# Patient Record
Sex: Female | Born: 1985 | Hispanic: No | Marital: Married | Smoking: Never smoker
Health system: Southern US, Community
[De-identification: ages and names within clinical notes are randomized; demographics above are authoritative.]

---

## 2019-12-23 ENCOUNTER — Other Ambulatory Visit: Payer: Self-pay

## 2019-12-23 ENCOUNTER — Emergency Department (HOSPITAL_BASED_OUTPATIENT_CLINIC_OR_DEPARTMENT_OTHER)
Admission: EM | Admit: 2019-12-23 | Discharge: 2019-12-23 | Disposition: A | Discharge status: Home / Self Care | Payer: BLUE CROSS/BLUE SHIELD | Attending: Emergency Medicine | Admitting: Emergency Medicine

## 2019-12-23 ENCOUNTER — Encounter (HOSPITAL_BASED_OUTPATIENT_CLINIC_OR_DEPARTMENT_OTHER): Payer: Self-pay | Admitting: Emergency Medicine

## 2019-12-23 DIAGNOSIS — Z20822 Contact with and (suspected) exposure to covid-19: Secondary | ICD-10-CM | POA: Diagnosis not present

## 2019-12-23 DIAGNOSIS — J069 Acute upper respiratory infection, unspecified: Secondary | ICD-10-CM | POA: Diagnosis not present

## 2019-12-23 LAB — RESP PANEL BY RT-PCR (FLU A&B, COVID) ARPGX2
Influenza A by PCR: NEGATIVE
Influenza B by PCR: NEGATIVE
SARS Coronavirus 2 by RT PCR: NEGATIVE

## 2019-12-23 NOTE — ED Notes (Signed)
Covid Swab obtained and to the lab 

## 2019-12-23 NOTE — ED Provider Notes (Signed)
[facility] EMERGENCY DEPARTMENT Provider Note   CSN: [id number] Arrival date & time: 12/23/19  0016     History Chief Complaint  Patient presents with  . URI    Yesenia Harvey is a 34 y.o. female.  HPI     This is a 34 year old female who presents requesting Covid testing.  Patient reports that she was at work on Friday when she began to feel chilled and generally weak.  She left work early.  Over the weekend she states she had fever T-max of 100.  She had some sore throat but that has improved.  No cough, shortness of breath, chest pain.  No loss of sense of taste or smell.  No known sick contacts or Covid exposures.  She has had intermittent myalgias.  Patient reports that she needs a negative Covid test to return to work.  She currently states she feels fine.  She reports she is fully vaccinated against COVID-19.  History reviewed. No pertinent past medical history.  There are no problems to display for this patient.   History reviewed. No pertinent surgical history.   OB History   No obstetric history on file.     No family history on file.  Social History   Tobacco Use  . Smoking status: Never Smoker  . Smokeless tobacco: Never Used  Substance Use Topics  . Alcohol use: Never  . Drug use: Never    Home Medications Prior to Admission medications   Not on File    Allergies    Patient has no allergy information on record.  Review of Systems   Review of Systems  Constitutional: Positive for chills and fever.  HENT: Positive for sore throat.   Respiratory: Negative for cough and shortness of breath.   Cardiovascular: Negative for chest pain.  Gastrointestinal: Negative for abdominal pain, nausea and vomiting.  Musculoskeletal: Positive for myalgias.  All other systems reviewed and are negative.   Physical Exam Updated Vital Signs BP 120/76 (BP Location: Right Arm)   Pulse 88   Temp 97.7 F (36.5 C) (Oral)   Resp 18   Ht 1.524 m (5')    Wt 59 kg   SpO2 100%   BMI 25.39 kg/m   Physical Exam Vitals and nursing note reviewed.  Constitutional:      Appearance: She is well-developed and well-nourished. She is not ill-appearing.  HENT:     Head: Normocephalic and atraumatic.     Nose: Nose normal.     Mouth/Throat:     Mouth: Mucous membranes are moist.     Pharynx: No posterior oropharyngeal erythema.  Eyes:     Pupils: Pupils are equal, round, and reactive to light.  Cardiovascular:     Rate and Rhythm: Normal rate and regular rhythm.     Heart sounds: Normal heart sounds.  Pulmonary:     Effort: Pulmonary effort is normal. No respiratory distress.     Breath sounds: No wheezing.  Abdominal:     General: Bowel sounds are normal.     Palpations: Abdomen is soft.  Musculoskeletal:     Cervical back: Neck supple.     Right lower leg: No edema.     Left lower leg: No edema.  Skin:    General: Skin is warm and dry.  Neurological:     Mental Status: She is alert and oriented to person, place, and time.  Psychiatric:        Mood and Affect: Mood and affect  and mood normal.     ED Results / Procedures / Treatments   Labs (all labs ordered are listed, but only abnormal results are displayed) Labs Reviewed  RESP PANEL BY RT-PCR (FLU A&B, COVID) ARPGX2    EKG None  Radiology No results found.  Procedures Procedures (including critical care time)  Medications Ordered in ED Medications - No data to display  ED Course  I have reviewed the triage vital signs and the nursing notes.  Pertinent labs & imaging results that were available during my care of the patient were reviewed by me and considered in my medical decision making (see chart for details).    MDM Rules/Calculators/A&P                          Patient presents with upper respiratory symptoms.  She is overall nontoxic and vital signs are reassuring.  She is requesting testing in order to return to work.  She currently is without complaint.   She is afebrile.  Physical exam is benign.  PCR test sent and she is negative for growth Covid and flu.  These were pended for the patient.  Given that she is without any complaints at this time, do not feel she needs further work-up.  After history, exam, and medical workup I feel the patient has been appropriately medically screened and is safe for discharge home. Pertinent diagnoses were discussed with the patient. Patient was given return precautions.  Final Clinical Impression(s) / ED Diagnoses Final diagnoses:  Viral upper respiratory tract infection    Rx / DC Orders ED Discharge Orders    None       Shon Baton, MD 12/23/19 [phone]

## 2019-12-23 NOTE — ED Triage Notes (Signed)
C/O fever, body aches, stuffy nose, and decreased taste and smell since Friday.  Reports she needs a PCR result in order to go back to work.

## 2019-12-23 NOTE — Discharge Instructions (Addendum)
You were seen today for fevers and myalgias.  Your Covid and influenza testing are negative.  You may return to work.

## 2020-12-16 ENCOUNTER — Ambulatory Visit: Payer: BC Managed Care – PPO | Admitting: Physical Therapy

## 2020-12-16 ENCOUNTER — Encounter (HOSPITAL_BASED_OUTPATIENT_CLINIC_OR_DEPARTMENT_OTHER): Payer: Self-pay

## 2020-12-16 ENCOUNTER — Other Ambulatory Visit: Payer: Self-pay

## 2020-12-16 ENCOUNTER — Emergency Department (HOSPITAL_BASED_OUTPATIENT_CLINIC_OR_DEPARTMENT_OTHER)
Admission: EM | Admit: 2020-12-16 | Discharge: 2020-12-16 | Disposition: A | Discharge status: Home / Self Care | Payer: BC Managed Care – PPO | Attending: Emergency Medicine | Admitting: Emergency Medicine

## 2020-12-16 DIAGNOSIS — Z20822 Contact with and (suspected) exposure to covid-19: Secondary | ICD-10-CM | POA: Insufficient documentation

## 2020-12-16 DIAGNOSIS — J029 Acute pharyngitis, unspecified: Secondary | ICD-10-CM | POA: Insufficient documentation

## 2020-12-16 DIAGNOSIS — R070 Pain in throat: Secondary | ICD-10-CM | POA: Diagnosis present

## 2020-12-16 LAB — RESP PANEL BY RT-PCR (FLU A&B, COVID) ARPGX2
Influenza A by PCR: NEGATIVE
Influenza B by PCR: NEGATIVE
SARS Coronavirus 2 by RT PCR: NEGATIVE

## 2020-12-16 LAB — GROUP A STREP BY PCR: Group A Strep by PCR: NOT DETECTED

## 2020-12-16 MED ORDER — ACETAMINOPHEN 325 MG PO TABS
650.0000 mg | ORAL_TABLET | Freq: Once | ORAL | Status: AC
  Administered 2020-12-16: 19:00:00 650 mg via ORAL
  Filled 2020-12-16: qty 2

## 2020-12-16 MED ORDER — LIDOCAINE VISCOUS HCL 2 % MT SOLN
15.0000 mL | Freq: Once | OROMUCOSAL | Status: AC
  Administered 2020-12-16: 19:00:00 15 mL via OROMUCOSAL
  Filled 2020-12-16: qty 15

## 2020-12-16 NOTE — ED Triage Notes (Signed)
Pt arrives ambulatory to ED with c/o sore throat starting yesterday with chills today. Denies being around anyone with flu or covid. Pt did take ibuprofen 200 mg around 4 pm.

## 2020-12-16 NOTE — ED Provider Notes (Signed)
[facility] EMERGENCY DEPARTMENT Provider Note   CSN: [id number] Arrival date & time: 12/16/20  1755     History Chief Complaint  Patient presents with   Sore Throat    Yesenia Harvey is a 35 y.o. female.  35 year old female who presents emergency department chief complaint of sore throat.  Patient states that the symptoms began in the middle of the night last night.  She states she had trouble sleeping due to the pain in her throat.  She took ibuprofen and throat spray without significant relief in her symptoms.  She has a history of strep throat in the past.  She has had no recent foreign travel.  She denies cough, fever.  She has pain with swallowing but is able to tolerate fluids  The history is provided by the patient. No language interpreter was used.  Sore Throat This is a new problem. The current episode started 12 to 24 hours ago. The problem occurs constantly. The problem has not changed since onset.Pertinent negatives include no chest pain, no abdominal pain, no headaches and no shortness of breath. The symptoms are aggravated by swallowing. Nothing relieves the symptoms. She has tried acetaminophen for the symptoms.      History reviewed. No pertinent past medical history.  There are no problems to display for this patient.   History reviewed. No pertinent surgical history.   OB History   No obstetric history on file.     No family history on file.  Social History   Tobacco Use   Smoking status: Never   Smokeless tobacco: Never  Vaping Use   Vaping Use: Never used  Substance Use Topics   Alcohol use: Never   Drug use: Never    Home Medications Prior to Admission medications   Medication Sig Start Date End Date Taking? Authorizing Provider  Levonorgestrel-Ethinyl Estradiol (AMETHIA) 0.15-0.03 &0.01 MG tablet Take 1 tablet by mouth daily. 12/08/20  Yes [provider]  medroxyPROGESTERone (PROVERA) 10 MG tablet Take by mouth. 12/08/20   Yes [provider]  Multiple Vitamin (MULTIVITAMIN) capsule Take 1 capsule by mouth daily.    [provider]    Allergies    Patient has no known allergies.  Review of Systems   Review of Systems  Respiratory:  Negative for shortness of breath.   Cardiovascular:  Negative for chest pain.  Gastrointestinal:  Negative for abdominal pain.  Neurological:  Negative for headaches.   Physical Exam Updated Vital Signs BP 120/68 (BP Location: Right Arm)    Pulse (!) 106    Temp 98.4 F (36.9 C) (Oral)    Resp 18    Ht 5' (1.524 m)    Wt 63 kg    LMP 11/25/2020    SpO2 100%    BMI 27.15 kg/m   Physical Exam  ED Results / Procedures / Treatments   Labs (all labs ordered are listed, but only abnormal results are displayed) Labs Reviewed  RESP PANEL BY RT-PCR (FLU A&B, COVID) ARPGX2  GROUP A STREP BY PCR    EKG None  Radiology No results found.  Procedures Procedures   Medications Ordered in ED Medications  lidocaine (XYLOCAINE) 2 % viscous mouth solution 15 mL (has no administration in time range)    ED Course  I have reviewed the triage vital signs and the nursing notes.  Pertinent labs & imaging results that were available during my care of the patient were reviewed by me and considered in my medical  decision making (see chart for details).    MDM Rules/Calculators/A&P                          Here with sore throat.  I ordered and reviewed a strep test as well as a flu and COVID test.  All of them are negative. Pt afebrile without tonsillar exudate, negative strep. Presents with mild cervical lymphadenopathy, & dysphagia; diagnosis of viral pharyngitis. No abx indicated. DC w symptomatic tx for pain  Pt does not appear dehydrated, but did discuss importance of water rehydration. Presentation non concerning for PTA or infxn spread to soft tissue. No trismus or uvula deviation. Specific return precautions discussed. Pt able to drink water in ED without  difficulty with intact air way. Recommended PCP follow up.    Final Clinical Impression(s) / ED Diagnoses Final diagnoses:  None    Rx / DC Orders ED Discharge Orders     None        Arthor Captain, PA-C 12/17/20 1544    Melene Plan, DO 12/18/20 1506

## 2020-12-16 NOTE — Discharge Instructions (Signed)
Contact a health care provider if: You have large, tender lumps in your neck. You have a rash. You cough up green, yellow-brown, or bloody mucus. Get help right away if: Your neck becomes stiff. You drool or are unable to swallow liquids. You cannot drink or take medicines without vomiting. You have severe pain that does not go away, even after you take medicine. You have trouble breathing, and it is not caused by a stuffy nose. You have new pain and swelling in your joints such as the knees, ankles, wrists, or elbows.

## 2020-12-18 ENCOUNTER — Ambulatory Visit: Payer: BC Managed Care – PPO | Admitting: Physical Therapy

## 2021-01-11 ENCOUNTER — Ambulatory Visit: Payer: BC Managed Care – PPO | Admitting: Physical Therapy

## 2021-01-14 ENCOUNTER — Encounter: Payer: BLUE CROSS/BLUE SHIELD | Admitting: Physical Therapy

## 2021-01-19 ENCOUNTER — Ambulatory Visit: Payer: BC Managed Care – PPO | Admitting: Physical Therapy

## 2021-01-21 ENCOUNTER — Encounter: Payer: Self-pay | Admitting: Physical Therapy

## 2021-01-21 ENCOUNTER — Ambulatory Visit: Payer: BC Managed Care – PPO | Attending: Family Medicine | Admitting: Physical Therapy

## 2021-01-21 ENCOUNTER — Other Ambulatory Visit: Payer: Self-pay

## 2021-01-21 ENCOUNTER — Telehealth: Payer: Self-pay | Admitting: Physical Therapy

## 2021-01-21 DIAGNOSIS — M6281 Muscle weakness (generalized): Secondary | ICD-10-CM | POA: Insufficient documentation

## 2021-01-21 DIAGNOSIS — M62838 Other muscle spasm: Secondary | ICD-10-CM | POA: Diagnosis present

## 2021-01-21 DIAGNOSIS — M79672 Pain in left foot: Secondary | ICD-10-CM | POA: Diagnosis present

## 2021-01-21 DIAGNOSIS — M79671 Pain in right foot: Secondary | ICD-10-CM | POA: Diagnosis present

## 2021-01-21 NOTE — Therapy (Signed)
[facility] [address]  [address] [place], [place], [address] Phone: [phone]   Fax:  [phone]  Physical Therapy Treatment  Patient Details  Name: Yesenia Harvey MRN: [id number] Date of Birth: 1985-06-09 Referring Provider (PT): Eula Listenominic McKinley   Encounter Date: 01/21/2021   PT End of Session - 01/21/21 1146     Visit Number 1    Number of Visits 12    Date for PT Re-Evaluation 03/04/21    Authorization Type [employer]    PT Start Time 1147    PT Stop Time 1240    PT Time Calculation (min) 53 min    Activity Tolerance Patient tolerated treatment well    Behavior [facility] Therapy [facility] for tasks assessed/performed             History reviewed. No pertinent past medical history.  History reviewed. No pertinent surgical history.  There were no vitals filed for this visit.   Subjective Assessment - 01/21/21 1150     Subjective In 2017 she had right heel pain when getting out of bed. Since then started with the left foot. She also has a bunion on the left foot. She also reports intermittent knee pain. She now has pain in bil heels upon standing in the morning and with stairs. She does some runners stretches but not every morning and only until it feels better.  Standing is limited to one hour but new shoes with slight heel have helped some with that.    Pertinent History iron deficiency    How long can you stand comfortably? 2 hours    How long can you walk comfortably? 30 min    Patient Stated Goals to get rid of heel pain    Currently in Pain? Yes    Pain Score 7     Pain Location Heel    Pain Orientation Right;Left    Pain Descriptors / Indicators Sharp    Pain Type Chronic pain    Pain Radiating Towards up into legs    Pain Onset More than a month ago    Pain Frequency Intermittent    Aggravating Factors  walking, prolonged standing    Pain Relieving Factors stretching, Clark's shoes, massage    Multiple Pain  Sites Yes    Pain Location Knee    Pain Orientation Right;Left    Pain Descriptors / Indicators Aching    Pain Type Chronic pain    Pain Onset More than a month ago    Pain Frequency Intermittent                [facility] PT Assessment - 01/21/21 0001       Assessment   Medical Diagnosis right and left plantar faciitis    Referring Provider (PT) Dominic Althea CharonMcKinley    Onset Date/Surgical Date 06/04/15    Prior Therapy no      Precautions   Precautions None      Restrictions   Weight Bearing Restrictions No      Balance Screen   Has the patient fallen in the past 6 months No    Has the patient had a decrease in activity level because of a fear of falling?  No    Is the patient reluctant to leave their home because of a fear of falling?  No      Home Environment   Living Environment Private residence    Additional Comments stairs hurt in the morning  Prior Function   Level of Independence Independent    Vocation Full time employment    Vocation Requirements sitting at computer      Posture/Postural Control   Posture Comments mild pronation of bil feet; left hallux valgus      ROM / Strength   AROM / PROM / Strength AROM;PROM;Strength      AROM   Overall AROM Comments B hips/knees WNL    AROM Assessment Site Ankle    Right/Left Ankle Right;Left    Right Ankle Dorsiflexion 0    Right Ankle Plantar Flexion 65    Right Ankle Inversion 38    Right Ankle Eversion 28    Left Ankle Dorsiflexion 7    Left Ankle Plantar Flexion 50    Left Ankle Inversion 35    Left Ankle Eversion 25      PROM   PROM Assessment Site Ankle    Right/Left Ankle Right;Left    Right Ankle Dorsiflexion 6    Right Ankle Plantar Flexion 75    Right Ankle Inversion 57    Right Ankle Eversion 40    Left Ankle Dorsiflexion 12    Left Ankle Plantar Flexion 54    Left Ankle Inversion 56    Left Ankle Eversion 42      Strength   Strength Assessment Site Hip;Knee;Ankle    Right/Left Hip  Right;Left    Right Hip Flexion 4-/5    Right Hip Extension 4+/5    Right Hip External Rotation  4+/5    Right Hip Internal Rotation 4+/5    Right Hip ABduction 4/5    Left Hip Flexion 4/5    Left Hip Extension 4-/5    Left Hip External Rotation 4+/5    Left Hip Internal Rotation 4+/5    Left Hip ABduction 4-/5    Right/Left Knee Right;Left    Right Knee Flexion 4-/5    Right Knee Extension 4-/5    Left Knee Flexion 4-/5    Left Knee Extension 4/5    Right/Left Ankle Right;Left    Right Ankle Dorsiflexion 4+/5    Right Ankle Plantar Flexion 5/5    Right Ankle Inversion 4/5    Right Ankle Eversion 4+/5    Left Ankle Dorsiflexion 4+/5    Left Ankle Plantar Flexion 4/5    Left Ankle Inversion 4/5    Left Ankle Eversion 4+/5      Flexibility   Soft Tissue Assessment /Muscle Length yes    Hamstrings marked tightness B    Quadriceps WNL    ITB WNL    Piriformis right tight      Palpation   Palpation comment mild tenderness Rt plantar fascia insertion; increased tone TPs left lateral gastroc      Ambulation/Gait   Gait Comments WNL                                    PT Education - 01/21/21 1306     Education Details HEP; POC    Person(s) Educated Patient    Methods Explanation;Demonstration;Handout    Comprehension Verbalized understanding;Returned demonstration              PT Short Term Goals - 01/21/21 1306       PT SHORT TERM GOAL #1   Title ind and compliant with initial HEP    Time 2    Period Weeks  Status New    Target Date 02/04/21      PT SHORT TERM GOAL #2   Title Patient to regularly stand and walk throughout the day to decrease effects of sedentary job on strength and flexibility.    Time 3    Period Weeks    Status New               PT Long Term Goals - 01/21/21 1606       PT LONG TERM GOAL #1   Title Patient to report no pain in bil fee upon standing in the morning and decreased pain by 90% or more  with walking, stairs and standing to cook meals.    Time 6    Period Weeks    Status New    Target Date 03/04/21      PT LONG TERM GOAL #2   Title Patient to demonstrate improved strength in B LEs to 4+/5 or better to help decrease knee pain and improve function.    Time 6    Period Weeks    Status New      PT LONG TERM GOAL #3   Title Improved right ankle DF to >= 7 deg and left ankle PF to >= 60 deg to normalize ankle mobility.    Time 6    Period Weeks    Status New      PT LONG TERM GOAL #4   Title Patient to report decreased knee pain by >= 90% with ADLs.    Time 6    Period Weeks    Status New                   Plan - 01/21/21 1253     Clinical Impression Statement Patient presents with c/o bil foot pain since 2017. Pain started in the right heel but now she feels pain in both upon standing in the morning, with walking, stairs and with prolonged standing when cooking. Pain has improved somewhat since she began stretching and also due to wearing shoes with a slight heel, but when she wears flats the pain returns. She also reports intermittent bil knee pain which she thinks is related to the foot pain. Yesenia PandySaadia has flexibility deficits affecting right ankle DF and left ankle plantar flexion ROM. She has normal bil great toe extension and full bil knee and hip ROM. She is very tight in bil hamstrings and has moderate right piriformis tightness. She is generally deconditioned in BLE as far as strength goes which is likely contributing to her distal pain. She will benefit from skilled PT to address these deficits.    Personal Factors and Comorbidities Fitness;Time since onset of injury/illness/exacerbation    Examination-Activity Limitations Locomotion Level;Stand;Stairs    Examination-Participation Restrictions Meal Prep    Stability/Clinical Decision Making Stable/Uncomplicated    Clinical Decision Making Low    Rehab Potential Excellent    PT Frequency 2x / week    PT  Duration 6 weeks    PT Treatment/Interventions ADLs/Self Care Home Management;Cryotherapy;Electrical Stimulation;Iontophoresis 4mg /ml Dexamethasone;Moist Heat;Ultrasound;Neuromuscular re-education;Balance training;Therapeutic exercise;Therapeutic activities;Patient/family education;Manual techniques;Taping    PT Next Visit Plan Review HEP and progress as needed; work on BLE strength, DN to plantar fascia/gastrocs (have not discussed yet); eccentric heel raises; encourage mobility during day (see STG)    PT Home Exercise Plan [email or web address]    Consulted and Agree with Plan of Care Patient             Patient will  benefit from skilled therapeutic intervention in order to improve the following deficits and impairments:  Decreased range of motion, Pain, Impaired flexibility, Decreased strength, Postural dysfunction  Visit Diagnosis: Pain in left foot  Pain in right foot  Muscle weakness (generalized)  Other muscle spasm     Problem List There are no problems to display for this patient.  Solon Palm, PT 01/21/2021, 4:15 PM  [facility] [address]  [address] [place], [place], [address] Phone: [phone]   Fax:  [phone]  Name: Yesenia Harvey MRN: [id number] Date of Birth: [birth year]

## 2021-01-21 NOTE — Patient Instructions (Signed)
Access Code: [email or web address] URL: [email or web address] Date: 01/21/2021 Prepared by: Raynelle Fanning  Exercises Supine Hamstring Stretch with Strap - 2 x daily - 7 x weekly - 1 sets - 3 reps - 60 sec hold Supine Hamstring Stretch with Doorway - 1 x daily - 7 x weekly - 1 sets - 3 reps - 60 sec hold Gastroc Stretch on Step - 1 x daily - 7 x weekly - 1 sets - 3 reps - 30-60 sec hold Heel Raises with Counter Support - 1 x daily - 7 x weekly - 1-3 sets - 10 reps - 3 sec hold

## 2021-01-25 ENCOUNTER — Encounter: Payer: Self-pay | Admitting: Physical Therapy

## 2021-01-25 ENCOUNTER — Other Ambulatory Visit: Payer: Self-pay

## 2021-01-25 ENCOUNTER — Ambulatory Visit: Payer: BC Managed Care – PPO | Admitting: Physical Therapy

## 2021-01-25 DIAGNOSIS — M79671 Pain in right foot: Secondary | ICD-10-CM

## 2021-01-25 DIAGNOSIS — M79672 Pain in left foot: Secondary | ICD-10-CM

## 2021-01-25 DIAGNOSIS — M62838 Other muscle spasm: Secondary | ICD-10-CM

## 2021-01-25 DIAGNOSIS — M6281 Muscle weakness (generalized): Secondary | ICD-10-CM

## 2021-01-25 NOTE — Therapy (Addendum)
PHYSICAL THERAPY DISCHARGE SUMMARY  Visits from Start of Care: 2  Current functional level related to goals / functional outcomes: See below.  Patient has had excessive no-shows/late cancellations due to work (cancelled 10 appts same day, no-showed 1), and only completed 2 visits during plan of care.     Remaining deficits: See below   Education / Equipment: HEP  Plan: Patient goals were not met. Patient is being discharged due to poor compliance/attendance.  She will need new order to return to therapy.    She may need different location due to work hours.     Rennie Natter, PT, DPT   [facility] [address]  [place], [place], [address] Phone: [phone]   Fax:  [phone]  Physical Therapy Treatment  Patient Details  Name: Yesenia Harvey MRN: [id number] Date of Birth: 1985/04/04 Referring Provider (PT): Rhina Brackett   Encounter Date: 01/25/2021   PT End of Session - 01/25/21 1110     Visit Number 2    Number of Visits 12    Date for PT Re-Evaluation 03/04/21    Authorization Type BC[employer]    PT Start Time 1110    PT Stop Time 1155    PT Time Calculation (min) 45 min    Activity Tolerance Patient tolerated treatment well    Behavior During Therapy [facility] for tasks assessed/performed             History reviewed. No pertinent past medical history.  History reviewed. No pertinent surgical history.  There were no vitals filed for this visit.   Subjective Assessment - 01/25/21 1111     Subjective Pt. reports that she has been doing the stretches and the one on the stairs helping.  Noticed more quad pain and low back tiredness Saturday.    Pertinent History iron deficiency    How long can you stand comfortably? 2 hours    How long can you walk comfortably? 30 min    Patient Stated Goals to get rid of heel pain    Currently in Pain? No/denies    Pain Onset More than a month ago    Pain Onset  More than a month ago                               [facility] Adult PT Treatment/Exercise - 01/25/21 0001       Self-Care   Self-Care Other Self-Care Comments   education on importance of frequent movement breaks, daily exercise     Exercises   Exercises Knee/Hip      Knee/Hip Exercises: Stretches   Passive Hamstring Stretch Both;1 rep;30 seconds    Passive Hamstring Stretch Limitations supine with stretch out strap      Knee/Hip Exercises: Aerobic   Nustep L5 x 6 min      Knee/Hip Exercises: Machines for Strengthening   Cybex Knee Extension 20# x 15    Cybex Knee Flexion 20# x 15    Cybex Leg Press 20# x 10      Knee/Hip Exercises: Standing   Heel Raises Both;10 reps    Hip Abduction Stengthening;Both;10 reps    Hip Extension Stengthening;Both;10 reps    Functional Squat 2 sets;10 reps    Functional Squat Limitations chair behind for safety, cues for form      Knee/Hip Exercises: Supine   Bridges Strengthening;10 reps    Other Supine  Knee/Hip Exercises ankle alphabet supine      Manual Therapy   Manual Therapy Soft tissue mobilization    Manual therapy comments to decrease muscle soreness    Soft tissue mobilization brief STM with roller stick for demo, with return demo by patient.                       PT Short Term Goals - 01/25/21 1212       PT SHORT TERM GOAL #1   Title ind and compliant with initial HEP    Time 2    Period Weeks    Status On-going   01/25/21- modified and progressed.   Target Date 02/04/21      PT SHORT TERM GOAL #2   Title Patient to regularly stand and walk throughout the day to decrease effects of sedentary job on strength and flexibility.    Time 3    Period Weeks    Status On-going   01/25/21 - educated              PT Long Term Goals - 01/25/21 1212       PT Shannon #1   Title Patient to report no pain in bil fee upon standing in the morning and decreased pain by 90% or more with  walking, stairs and standing to cook meals.    Time 6    Period Weeks    Status On-going    Target Date 03/04/21      PT LONG TERM GOAL #2   Title Patient to demonstrate improved strength in B LEs to 4+/5 or better to help decrease knee pain and improve function.    Time 6    Period Weeks    Status On-going      PT LONG TERM GOAL #3   Title Improved right ankle DF to >= 7 deg and left ankle PF to >= 60 deg to normalize ankle mobility.    Time 6    Period Weeks    Status On-going      PT LONG TERM GOAL #4   Title Patient to report decreased knee pain by >= 90% with ADLs.    Time 6    Period Weeks    Status On-going                   Plan - 01/25/21 1209     Clinical Impression Statement Patient reports good compliance with initial HEP, but had questions today about purchasing stretching strap, different options given, also recommended massage roller for quads.  She reports more pain in quads than heels today.  Spent most of the session reviewing and discussing options for exercising at gym, trialing gym equipment, followed by progressing HEP for more glut strengthening exercises.  Tolerated very well although some fatigue reported.  Also recommended setting timer for quick movement breaks after 1 hour and then trying to take daily walk at lunch, as other ways to incorporate more movement into daily routine and decrease fatigue.    Personal Factors and Comorbidities Fitness;Time since onset of injury/illness/exacerbation    Examination-Activity Limitations Locomotion Level;Stand;Stairs    Examination-Participation Restrictions Meal Prep    Stability/Clinical Decision Making Stable/Uncomplicated    Rehab Potential Excellent    PT Frequency 2x / week    PT Duration 6 weeks    PT Treatment/Interventions ADLs/Self Care Home Management;Cryotherapy;Electrical Stimulation;Iontophoresis 17m/ml Dexamethasone;Moist Heat;Ultrasound;Neuromuscular re-education;Balance  training;Therapeutic exercise;Therapeutic activities;Patient/family education;Manual techniques;Taping  PT Next Visit Plan Review HEP and progress as needed; work on BLE strength, DN to plantar fascia/gastrocs (have not discussed yet); eccentric heel raises; encourage mobility during day (see STG)    PT Home Exercise Plan [id number]    Consulted and Agree with Plan of Care Patient             Patient will benefit from skilled therapeutic intervention in order to improve the following deficits and impairments:  Decreased range of motion, Pain, Impaired flexibility, Decreased strength, Postural dysfunction  Visit Diagnosis: Pain in left foot  Pain in right foot  Muscle weakness (generalized)  Other muscle spasm     Problem List There are no problems to display for this patient.   Rennie Natter, PT, DPT  01/25/2021, 12:17 PM  [facility] [address]  [place], [place], [address] Phone: [phone]   Fax:  [phone]  Name: Yesenia Harvey MRN: [id number] Date of Birth: 10-11-1985

## 2021-01-28 ENCOUNTER — Ambulatory Visit: Payer: BC Managed Care – PPO | Admitting: Physical Therapy

## 2021-02-01 ENCOUNTER — Ambulatory Visit: Payer: BC Managed Care – PPO | Admitting: Physical Therapy

## 2021-02-04 ENCOUNTER — Ambulatory Visit: Payer: BC Managed Care – PPO | Admitting: Physical Therapy

## 2021-02-08 ENCOUNTER — Ambulatory Visit: Payer: BC Managed Care – PPO | Admitting: Physical Therapy

## 2021-02-11 ENCOUNTER — Ambulatory Visit: Payer: BC Managed Care – PPO | Admitting: Physical Therapy

## 2021-02-15 ENCOUNTER — Ambulatory Visit: Payer: BC Managed Care – PPO | Admitting: Physical Therapy

## 2021-02-18 ENCOUNTER — Ambulatory Visit: Payer: BC Managed Care – PPO | Admitting: Physical Therapy

## 2021-04-29 ENCOUNTER — Emergency Department (HOSPITAL_BASED_OUTPATIENT_CLINIC_OR_DEPARTMENT_OTHER): Payer: BC Managed Care – PPO

## 2021-04-29 ENCOUNTER — Encounter (HOSPITAL_BASED_OUTPATIENT_CLINIC_OR_DEPARTMENT_OTHER): Payer: Self-pay

## 2021-04-29 ENCOUNTER — Emergency Department (HOSPITAL_BASED_OUTPATIENT_CLINIC_OR_DEPARTMENT_OTHER)
Admission: EM | Admit: 2021-04-29 | Discharge: 2021-04-29 | Disposition: A | Discharge status: Home / Self Care | Payer: BC Managed Care – PPO | Attending: Emergency Medicine | Admitting: Emergency Medicine

## 2021-04-29 DIAGNOSIS — R0789 Other chest pain: Secondary | ICD-10-CM | POA: Insufficient documentation

## 2021-04-29 DIAGNOSIS — R052 Subacute cough: Secondary | ICD-10-CM | POA: Insufficient documentation

## 2021-04-29 DIAGNOSIS — J3489 Other specified disorders of nose and nasal sinuses: Secondary | ICD-10-CM | POA: Insufficient documentation

## 2021-04-29 MED ORDER — LIDOCAINE 5 % EX PTCH
1.0000 | MEDICATED_PATCH | CUTANEOUS | 0 refills | Status: AC
Start: 2021-04-29 — End: ?

## 2021-04-29 MED ORDER — CETIRIZINE HCL 10 MG PO TABS: 10.0000 mg | ORAL_TABLET | Freq: Every day | ORAL | 0 refills | Status: AC

## 2021-04-29 MED ORDER — NAPROXEN 500 MG PO TABS: 500.0000 mg | ORAL_TABLET | Freq: Two times a day (BID) | ORAL | 0 refills | Status: AC

## 2021-04-29 MED ORDER — BENZONATATE 100 MG PO CAPS
100.0000 mg | ORAL_CAPSULE | Freq: Three times a day (TID) | ORAL | 0 refills | Status: AC
Start: 2021-04-29 — End: ?

## 2021-04-29 NOTE — ED Notes (Signed)
Discharge instructions and prescriptions reviewed with patient and significant other. All questions answered. Patient denies any needs or concerns. ?

## 2021-04-29 NOTE — ED Notes (Signed)
Pt. Has had a cough for a month per the Pt.   Pt. Is in no distress.  Pt. Reports that for the last 2 days she feels a pain in the R mid rib area.  No reports of nausea or dizziness.  Pt. Reports she has only coughed up some white froth in the past month from time to time. ?

## 2021-04-29 NOTE — ED Triage Notes (Signed)
Pt reports cough from allergies x 1 month, states x 2 days had right sided lower rib pain worse with deep breathing and cough  ? ?

## 2021-04-29 NOTE — Discharge Instructions (Addendum)
Take medications as prescribed ? ?Return for new or worsening symptoms such as worsening chest pain, coughing up blood, swelling to your legs, difficulty breathing ?

## 2021-04-29 NOTE — ED Provider Notes (Signed)
?[facility] EMERGENCY DEPARTMENT ?Provider Note ? ? ?CSN: [id number] ?Arrival date & time: 04/29/21  1825 ? ?  ? ?History ? ?Chief Complaint  ?Patient presents with  ? Cough  ? ? ?Yesenia Harvey is a 36 y.o. female with medical history significant for seasonal allergies here for evaluation of cough and right-sided rib pain.  Patient states she has had cough productive of clear sputum over the last month.  She associates this with seasonal allergies.  Some occasional rhinorrhea, itchy or watery eyes.  She takes Benadryl as needed.  States she has been coughing more than normal over the last week and 2 to 3 days ago noted some right-sided rib pain.  Pain worse with movement, coughing, deep breaths.  She denies any overt chest pain or pain at rest.  No exertional chest pain.  No lower extremity swelling, history of PE, DVT, recent surgery, immobilization or malignancy.  No fever, abdominal pain, nausea, vomiting.  No calf pain, swelling. Pain is not worse with food intake.  Does not radiate to the flanks.  Normal appetite at home.  No recent sick contacts. ? ?HPI ? ?  ? ?Home Medications ?Prior to Admission medications   ?Medication Sig Start Date End Date Taking? Authorizing Provider  ?benzonatate (TESSALON) 100 MG capsule Take 1 capsule (100 mg total) by mouth every 8 (eight) hours. 04/29/21  Yes Sheily Lineman A, PA-C  ?cetirizine (ZYRTEC ALLERGY) 10 MG tablet Take 1 tablet (10 mg total) by mouth daily. 04/29/21  Yes Jaimes Eckert A, PA-C  ?lidocaine (LIDODERM) 5 % Place 1 patch onto the skin daily. Remove & Discard patch within 12 hours or as directed by MD 04/29/21  Yes Basha Krygier A, PA-C  ?naproxen (NAPROSYN) 500 MG tablet Take 1 tablet (500 mg total) by mouth 2 (two) times daily. 04/29/21  Yes Hadas Jessop A, PA-C  ?ferrous sulfate 325 (65 FE) MG tablet Take 325 mg by mouth daily with breakfast.    [provider]  ?Levonorgestrel-Ethinyl Estradiol (AMETHIA) 0.15-0.03 &0.01 MG tablet  Take 1 tablet by mouth daily. ?Patient not taking: Reported on 01/21/2021 12/08/20   [provider]  ?medroxyPROGESTERone (PROVERA) 10 MG tablet Take by mouth. ?Patient not taking: Reported on 01/21/2021 12/08/20   [provider]  ?Multiple Vitamin (MULTIVITAMIN) capsule Take 1 capsule by mouth daily.    [provider]  ?Vitamin D, Ergocalciferol, (DRISDOL) 1.25 MG (50000 UNIT) CAPS capsule Take 50,000 Units by mouth every 7 (seven) days.    [provider]  ?   ? ?Allergies    ?Patient has no known allergies.   ? ?Review of Systems   ?Review of Systems  ?Constitutional: Negative.   ?HENT:  Positive for postnasal drip, rhinorrhea and sneezing. Negative for sinus pressure, sinus pain, sore throat, tinnitus, trouble swallowing and voice change.   ?Respiratory:  Positive for cough (x 1 month). Negative for apnea, choking, chest tightness, shortness of breath, wheezing and stridor.   ?Cardiovascular:  Positive for chest pain (right lower rib pain).  ?Gastrointestinal: Negative.   ?Genitourinary: Negative.   ?Musculoskeletal: Negative.   ?Skin: Negative.   ?Neurological: Negative.   ?All other systems reviewed and are negative. ? ?Physical Exam ?Updated Vital Signs ?BP 110/71 (BP Location: Right Arm)   Pulse 82   Temp 98.2 ?F (36.8 ?C) (Oral)   Resp 18   Ht 5' (1.524 m)   Wt 61.2 kg   LMP 04/17/2021 (Approximate)   SpO2 96%   BMI 26.37  kg/m?  ?Physical Exam ?Vitals and nursing note reviewed.  ?Constitutional:   ?   General: She is not in acute distress. ?   Appearance: She is well-developed. She is not ill-appearing, toxic-appearing or diaphoretic.  ?HENT:  ?   Head: Normocephalic and atraumatic.  ?   Nose: Rhinorrhea (clear) present.  ?   Mouth/Throat:  ?   Mouth: Mucous membranes are moist.  ?Eyes:  ?   Pupils: Pupils are equal, round, and reactive to light.  ?Cardiovascular:  ?   Rate and Rhythm: Normal rate.  ?   Pulses: Normal pulses.  ?   Heart sounds: Normal heart sounds.   ?Pulmonary:  ?   Effort: Pulmonary effort is normal. No respiratory distress.  ?   Breath sounds: Normal breath sounds and air entry.  ?   Comments: Clear bilaterally, speaks in full sentences without difficulty.  Occasional cough. ?Chest:  ? ? ?   Comments: Tenderness right lower ribs without crepitus, step-off.  No rash, vesicular lesions ?Abdominal:  ?   General: Bowel sounds are normal. There is no distension.  ?   Palpations: Abdomen is soft.  ?   Tenderness: There is no abdominal tenderness. There is no right CVA tenderness, left CVA tenderness or guarding. Negative signs include Murphy's sign.  ?   Comments: Soft, nontender, negative Murphy sign  ?Musculoskeletal:     ?   General: Normal range of motion.  ?   Cervical back: Normal range of motion and neck supple.  ?   Comments: No bony tenderness, compartments soft, Homans sign negative bilaterally  ?Skin: ?   General: Skin is warm and dry.  ?   Capillary Refill: Capillary refill takes less than 2 seconds.  ?   Comments: No edema, erythema or warmth.  No rash, vesicle  ?Neurological:  ?   General: No focal deficit present.  ?   Mental Status: She is alert.  ?   Cranial Nerves: Cranial nerves 2-12 are intact.  ?   Sensory: Sensation is intact.  ?   Motor: No weakness.  ?   Gait: Gait is intact.  ?Psychiatric:     ?   Mood and Affect: Mood normal.  ? ? ?ED Results / Procedures / Treatments   ?Labs ?(all labs ordered are listed, but only abnormal results are displayed) ?Labs Reviewed - No data to display ? ?EKG ?EKG Interpretation ? ?Date/Time:  Thursday April 29 2021 19:06:44 EDT ?Ventricular Rate:  80 ?PR Interval:  131 ?QRS Duration: 95 ?QT Interval:  377 ?QTC Calculation: 435 ?R Axis:   75 ?Text Interpretation: Sinus rhythm RSR' in V1 or V2, probably normal variant Minimal ST depression, inferior leads No old tracing to compare Confirmed by Linwood DibblesKnapp, Jon [phone]([id number]) on 04/29/2021 7:28:11 PM ? ?Radiology ?DG Chest Portable 1 View ? ?Result Date: 04/29/2021 ?CLINICAL  DATA:  Cough.  Bilateral rib pain for 1 month. EXAM: PORTABLE CHEST 1 VIEW COMPARISON:  None. FINDINGS: The cardiomediastinal contours are normal. The lungs are clear. Pulmonary vasculature is normal. No consolidation, pleural effusion, or pneumothorax. No acute osseous abnormalities are seen. IMPRESSION: Negative portable AP view of the chest. Electronically Signed   By: Narda RutherfordMelanie  Sanford M.D.   On: 04/29/2021 19:13   ? ?Procedures ?Procedures  ? ? ?Medications Ordered in ED ?Medications - No data to display ? ?ED Course/ Medical Decision Making/ A&P ?  ? ?36 year old here for evaluation of nonproductive cough over the last month and 2 to 3 days of  right-sided rib pain.  Pain worse with movement, deep breathing and coughing.  No clinical evidence of VTE on exam.  She is without tachycardia, tachypnea or hypoxia.  No recent surgery, mobilization or malignancy.  She is PERC negative, Wells criteria low risk.  She is afebrile, nonseptic, not ill-appearing.  Does sound like she also has seasonal allergies at baseline.  She is unclear clinically fluid overloaded and has no history of CHF.  No murmur on exam, hx of IVDU.  Abdomen is soft, nontender.  She has negative Murphy sign.  Low suspicion for intra-abdominal etiology such as cholecystitis, cholelithiasis, PUD.  Able to reproduce patient's pain with palpation to her right anterior lower ribs.  She has no overlying skin changes to suggest infectious process such as zoster, cellulitis.  Plan EKG, chest x-ray ? ?Imaging personally viewed and interpreted: ? ?DG chest without acute infiltrates, cardiomegaly, pulm edema, pneumothorax ?EKG without ischemic changes, no S1Q3T3 ? ?Discussed results with patient in room.  Suspect costochondritis, chest wall pain from coughing.  Suspect cough due to seasonal allergies.  Will provide symptomatic management, have her follow-up with PCP.  At this time I have low suspicion for acute ACS, PE, dissection, intra-abdominal etiology,  pneumothorax as cause of her symptoms ? ?The patient has been appropriately medically screened and/or stabilized in the ED. I have low suspicion for any other emergent medical condition which would require furthe

## 2023-11-04 IMAGING — DX DG CHEST 1V PORT
1 series · 1 of 1 positions shown · non-contrast
Comparison: None.

CLINICAL DATA: Cough.  Bilateral rib pain for 1 month.

EXAM:
PORTABLE CHEST 1 VIEW

[chest ap]
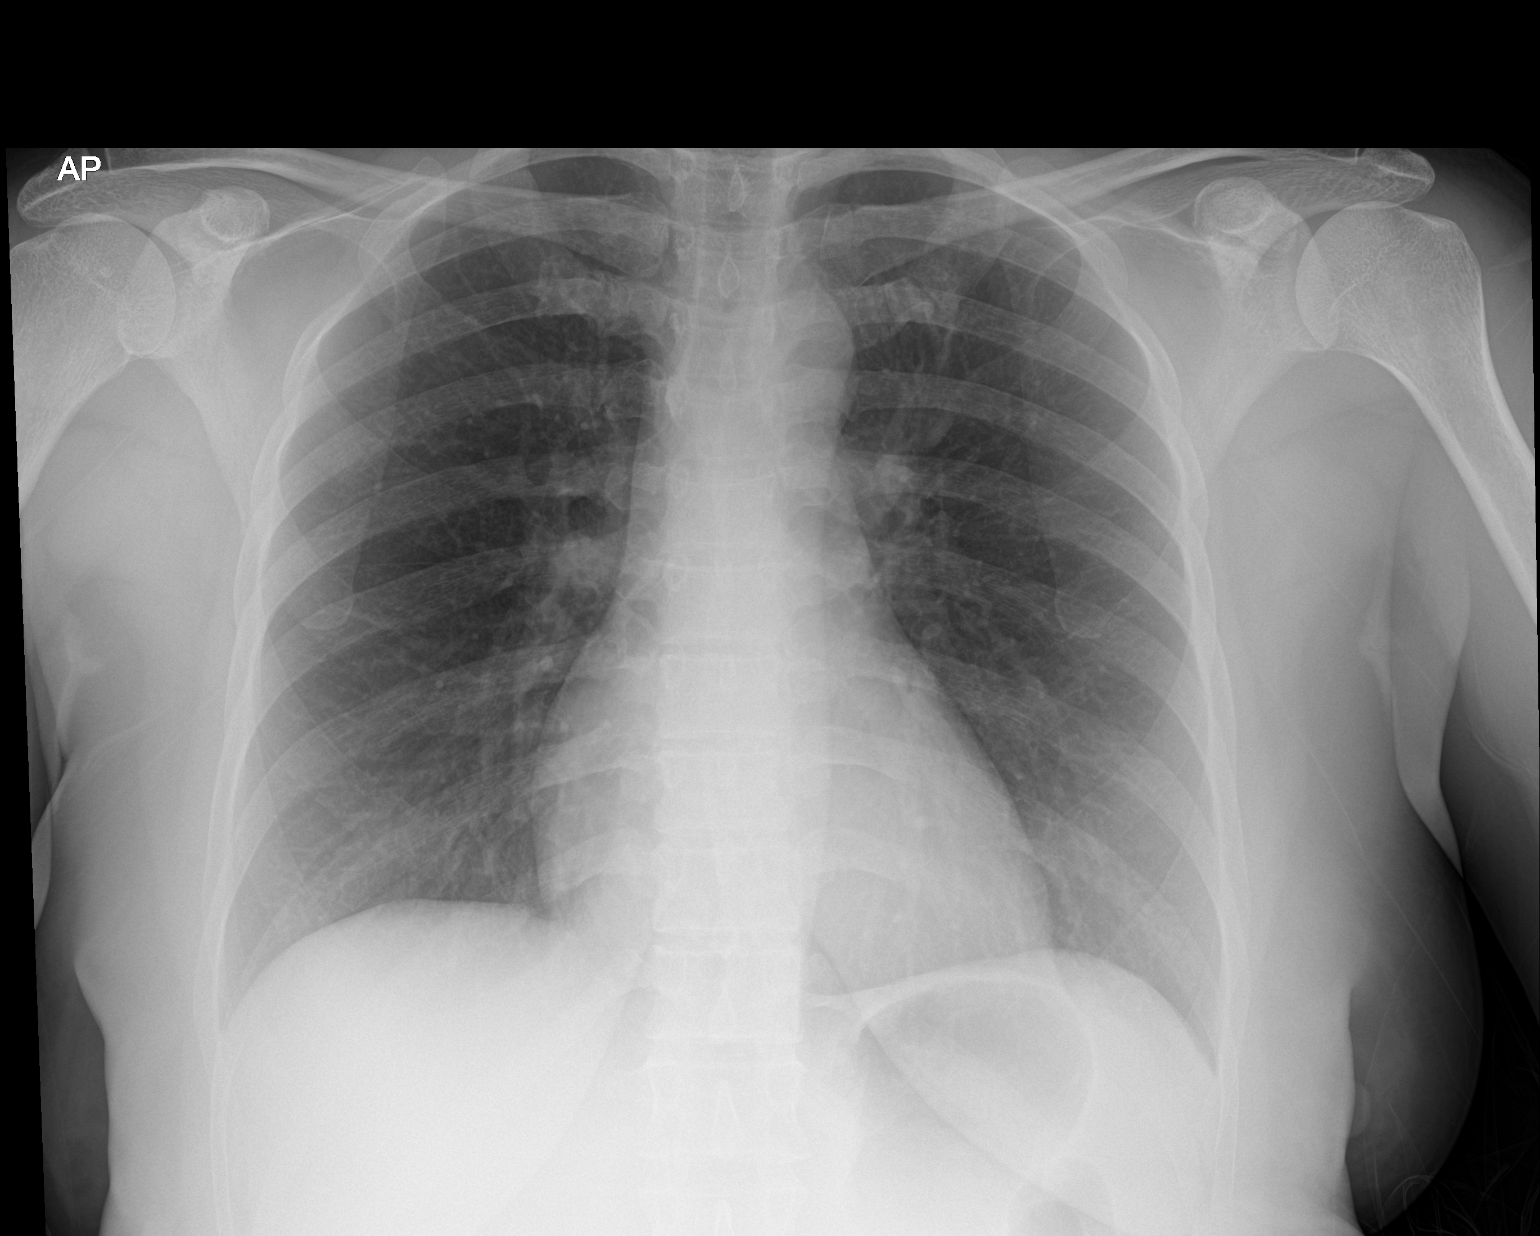

[1 of 1 positions shown; findings below may reference images not displayed]

FINDINGS: The cardiomediastinal contours are normal. The lungs are clear.
Pulmonary vasculature is normal. No consolidation, pleural effusion,
or pneumothorax. No acute osseous abnormalities are seen.
IMPRESSION: Negative portable AP view of the chest.
